# Patient Record
Sex: Male | Born: 1955 | Hispanic: No | Marital: Married | Smoking: Never smoker
Health system: Southern US, Community
[De-identification: ages and names within clinical notes are randomized; demographics above are authoritative.]

---

## 2005-06-22 ENCOUNTER — Ambulatory Visit: Payer: Self-pay | Admitting: Internal Medicine

## 2005-08-14 ENCOUNTER — Ambulatory Visit: Payer: Self-pay | Admitting: Internal Medicine

## 2005-10-02 ENCOUNTER — Ambulatory Visit: Payer: Self-pay | Admitting: Internal Medicine

## 2005-12-12 ENCOUNTER — Encounter: Admission: RE | Admit: 2005-12-12 | Discharge: 2006-02-07 | Payer: Self-pay | Admitting: Internal Medicine

## 2006-04-12 ENCOUNTER — Ambulatory Visit: Payer: Self-pay | Admitting: Internal Medicine

## 2006-08-08 ENCOUNTER — Encounter: Admission: RE | Admit: 2006-08-08 | Discharge: 2006-09-16 | Payer: Self-pay | Admitting: Orthopaedic Surgery

## 2008-05-12 ENCOUNTER — Encounter: Admission: RE | Admit: 2008-05-12 | Discharge: 2008-06-16 | Payer: Self-pay | Admitting: Orthopaedic Surgery

## 2009-02-17 ENCOUNTER — Encounter: Admission: RE | Admit: 2009-02-17 | Discharge: 2009-05-18 | Payer: Self-pay | Admitting: Orthopaedic Surgery

## 2011-06-13 ENCOUNTER — Telehealth: Payer: Self-pay | Admitting: Internal Medicine

## 2011-06-13 NOTE — Telephone Encounter (Signed)
error 

## 2014-08-24 ENCOUNTER — Emergency Department (HOSPITAL_COMMUNITY): Payer: BLUE CROSS/BLUE SHIELD

## 2014-08-24 ENCOUNTER — Emergency Department (HOSPITAL_COMMUNITY)
Admission: EM | Admit: 2014-08-24 | Discharge: 2014-08-24 | Disposition: A | Discharge status: Home / Self Care | Payer: BLUE CROSS/BLUE SHIELD | Attending: Emergency Medicine | Admitting: Emergency Medicine

## 2014-08-24 ENCOUNTER — Encounter (HOSPITAL_COMMUNITY): Payer: Self-pay

## 2014-08-24 DIAGNOSIS — Z8619 Personal history of other infectious and parasitic diseases: Secondary | ICD-10-CM | POA: Diagnosis not present

## 2014-08-24 DIAGNOSIS — R079 Chest pain, unspecified: Secondary | ICD-10-CM | POA: Diagnosis present

## 2014-08-24 DIAGNOSIS — Z79899 Other long term (current) drug therapy: Secondary | ICD-10-CM | POA: Diagnosis not present

## 2014-08-24 LAB — BASIC METABOLIC PANEL
Anion gap: 9 (ref 5–15)
BUN: 16 mg/dL (ref 6–23)
CO2: 25 mmol/L (ref 19–32)
Calcium: 9 mg/dL (ref 8.4–10.5)
Chloride: 103 mmol/L (ref 96–112)
Creatinine, Ser: 0.94 mg/dL (ref 0.50–1.35)
GFR calc Af Amer: >90 mL/min (ref >90)
Glucose, Bld: 105 mg/dL — ABNORMAL HIGH (ref 70–99)
Potassium: 4 mmol/L (ref 3.5–5.1)
Sodium: 137 mmol/L (ref 135–145)

## 2014-08-24 LAB — CBC
HCT: 43 % (ref 39.0–52.0)
Hemoglobin: 14.8 g/dL (ref 13.0–17.0)
MCH: 30.9 pg (ref 26.0–34.0)
MCHC: 34.4 g/dL (ref 30.0–36.0)
MCV: 89.8 fL (ref 78.0–100.0)
PLATELETS: 186 10*3/uL (ref 150–400)
RBC: 4.79 MIL/uL (ref 4.22–5.81)
RDW: 12.6 % (ref 11.5–15.5)
WBC: 6.2 10*3/uL (ref 4.0–10.5)

## 2014-08-24 LAB — I-STAT TROPONIN, ED
Troponin i, poc: 0 ng/mL (ref 0.00–0.08)
Troponin i, poc: 0 ng/mL (ref 0.00–0.08)

## 2014-08-24 LAB — BRAIN NATRIURETIC PEPTIDE: B NATRIURETIC PEPTIDE 5: 9.8 pg/mL (ref 0.0–100.0)

## 2014-08-24 MED ORDER — ASPIRIN 81 MG PO CHEW: 324.0000 mg | CHEWABLE_TABLET | Freq: Every day | ORAL | Status: AC

## 2014-08-24 MED ORDER — OMEPRAZOLE 20 MG PO CPDR: 20.0000 mg | DELAYED_RELEASE_CAPSULE | Freq: Every day | ORAL | Status: AC

## 2014-08-24 NOTE — Discharge Instructions (Signed)

## 2014-08-24 NOTE — ED Notes (Signed)
Pt c/o intermittent L side ribcage tightness radiating into L arm x 1 week.  Pain score 1/10.  Pt reports arm pain feels like tingling.  Pt was started on a shingles medication x 1 week ago and sts "I've been under a lot of stress."

## 2014-08-24 NOTE — ED Provider Notes (Signed)
CSN: [id number]     Arrival date & time 08/24/14  1315 History   First MD Initiated Contact with Patient 08/24/14 1458     Chief Complaint  Patient presents with  . Rib cage pain     Patient is a 59 y.o. male presenting with chest pain. The history is provided by the patient.  Chest Pain Pain location:  L chest Pain quality: sharp and tightness   Pain radiates to:  L arm (arm starts to feel numb and tingling) Pain severity:  Severe Duration:  1 week Timing:  Intermittent (each episode lasting a few hours, occuring at least daily) Chronicity:  New Context comment:  He is taking shingles medications but the symptoms are located on the right side Exacerbated by: doest seem to come on with exertion, he has noticed that when he is drinking coffee it does seem to bring it on, he did take coffee today. Associated symptoms: no fever, no nausea, no PND, no shortness of breath and no weakness   Associated symptoms comment:  He felt lightheaded  Risk factors: no coronary artery disease, no high cholesterol, no hypertension, no prior DVT/PE and no smoking     History reviewed. No pertinent past medical history. History reviewed. No pertinent past surgical history. History reviewed. No pertinent family history. History  Substance Use Topics  . Smoking status: Never Smoker   . Smokeless tobacco: Not on file  . Alcohol Use: Yes    Review of Systems  Constitutional: Negative for fever.  Respiratory: Negative for shortness of breath.   Cardiovascular: Positive for chest pain. Negative for PND.  Gastrointestinal: Negative for nausea.  Neurological: Negative for weakness.      Allergies  Other  Home Medications   Prior to Admission medications   Medication Sig Start Date End Date Taking? Authorizing Provider  Cholecalciferol (VITAMIN D-3 PO) Take 1 tablet by mouth daily.   Yes Historical Provider, MD  FIBER PO Take 1 tablet by mouth daily.   Yes Historical Provider, MD   glucosamine-chondroitin 500-400 MG tablet Take 1 tablet by mouth daily.   Yes Historical Provider, MD  Omega-3 Fatty Acids (OMEGA 3 PO) Take 1 capsule by mouth daily.   Yes Historical Provider, MD  valACYclovir (VALTREX) 1000 MG tablet Take 1 tablet by mouth 3 (three) times daily. 10 days picked up 08/11/14 08/11/14  Yes Historical Provider, MD   BP 159/83 mmHg  Pulse 66  Temp(Src) 98.1 F (36.7 C) (Oral)  Resp 14  SpO2 100% Physical Exam  Constitutional: He appears well-developed and well-nourished. No distress.  HENT:  Head: Normocephalic and atraumatic.  Right Ear: External ear normal.  Left Ear: External ear normal.  Eyes: Conjunctivae are normal. Right eye exhibits no discharge. Left eye exhibits no discharge. No scleral icterus.  Neck: Neck supple. No tracheal deviation present.  Cardiovascular: Normal rate, regular rhythm and intact distal pulses.   Pulmonary/Chest: Effort normal and breath sounds normal. No stridor. No respiratory distress. He has no wheezes. He has no rales.  Abdominal: Soft. Bowel sounds are normal. He exhibits no distension. There is no tenderness. There is no rebound and no guarding.  Musculoskeletal: He exhibits no edema or tenderness.  Neurological: He is alert. He has normal strength. No cranial nerve deficit (no facial droop, extraocular movements intact, no slurred speech) or sensory deficit. He exhibits normal muscle tone. He displays no seizure activity. Coordination normal.  Skin: Skin is warm and dry. No rash noted.  Psychiatric: He has  a normal mood and affect.  Nursing note and vitals reviewed.   ED Course  Procedures (including critical care time) Labs Review Labs Reviewed  BASIC METABOLIC PANEL - Abnormal; Notable for the following:    Glucose, Bld 105 (*)    All other components within normal limits  CBC  BRAIN NATRIURETIC PEPTIDE  I-STAT TROPOININ, ED  I-STAT TROPOININ, ED    Imaging Review No results found.   EKG  Interpretation   Date/Time:  Tuesday August 24 2014 13:20:34 EST Ventricular Rate:  72 PR Interval:  157 QRS Duration: 116 QT Interval:  410 QTC Calculation: 449 R Axis:   39 Text Interpretation:  Sinus rhythm Nonspecific intraventricular conduction  delay No old tracing to compare Confirmed by Tzippy Testerman  MD-J, Kayelynn Abdou ([id number]) on  08/24/2014 3:00:19 PM      MDM   Final diagnoses:  Chest pain, unspecified chest pain type    Pt's symptoms are moderately suspicious.  He does not have symptoms with exertion.  Low risk for heart disease, heart score of 1.  Reasonable for outpatient follow up.  He has noticed some symptoms while drinking coffee so I will have him start an antacid.   Linwood DibblesJon Jenia Klepper, MD 08/24/14 [phone]

## 2014-08-24 NOTE — Progress Notes (Signed)
  CARE MANAGEMENT ED NOTE 08/24/2014  Patient:  Frederick Watson,Frederick Watson   Account Number:  [id number]  Date Initiated:  08/24/2014  Documentation initiated by:  Edd ArbourGIBBS,Quantez Schnyder  Subjective/Objective Assessment:   59 yr old bcbs pt c/o intermittent L side ribcage tightness radiating into L arm x 1 week.  Pain score 1/10.  Pt reports arm pain feels like tingling.  Pt was started on a shingles medication x 1 week ago and sts "I've been under a lot of     Subjective/Objective Assessment Detail:   stress."          No pcp as confirmed by pt     Action/Plan:   see notes below   Action/Plan Detail:   Anticipated DC Date:  08/24/2014     Status Recommendation to Physician:   Result of Recommendation:    Other ED Services  Consult Working Plan    DC Planning Services  Other  Outpatient Services - Pt will follow up    Choice offered to / List presented to:            Status of service:  Completed, signed off  ED Comments:   ED Comments Detail:  [facility] ED CM spoke with pt on how to obtain an in network pcp with insurance coverage via the customer service number or web site Cm reviewed ED level of care for crisis/emergent services and community pcp level of care to manage continuous or chronic medical concerns.  The pt voiced understanding CM encouraged pt and discussed pt's responsibility to verify with pt's insurance carrier that any recommended medical provider offered by any emergency room or a hospital provider is within the carrier's network. The pt voiced understanding

## 2014-09-14 ENCOUNTER — Encounter: Payer: BLUE CROSS/BLUE SHIELD | Admitting: Cardiovascular Disease

## 2014-10-17 NOTE — Progress Notes (Signed)
erron

## 2014-10-18 ENCOUNTER — Encounter: Payer: BLUE CROSS/BLUE SHIELD | Admitting: Cardiovascular Disease

## 2014-11-03 ENCOUNTER — Encounter: Payer: Self-pay | Admitting: Cardiovascular Disease

## 2015-11-28 IMAGING — CR DG CHEST 2V
2 series · 2 of 2 positions shown · non-contrast
Comparison: None.

CLINICAL DATA: Left-sided chest pain

EXAM:
CHEST  2 VIEW

[w chest pa]
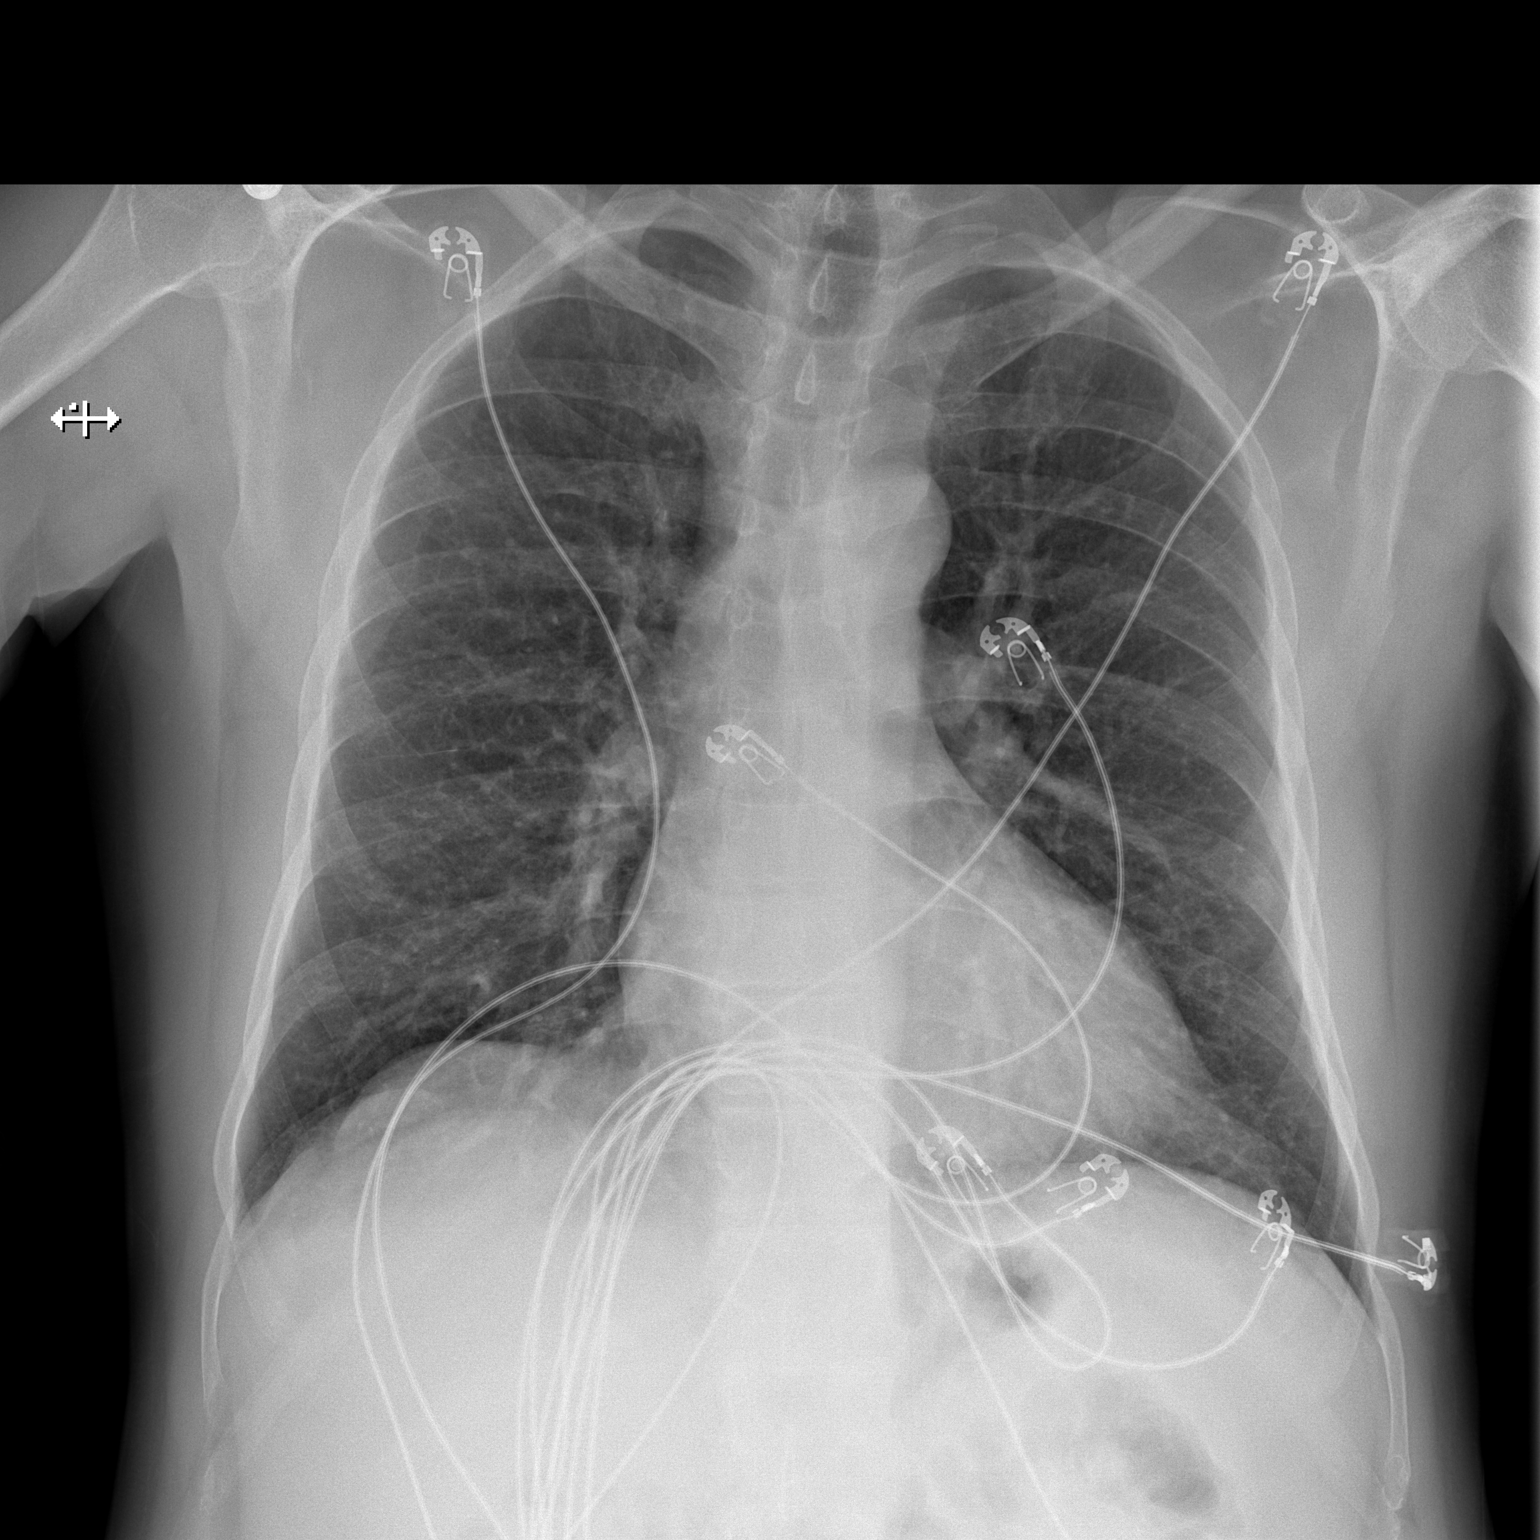

[w chest lat]
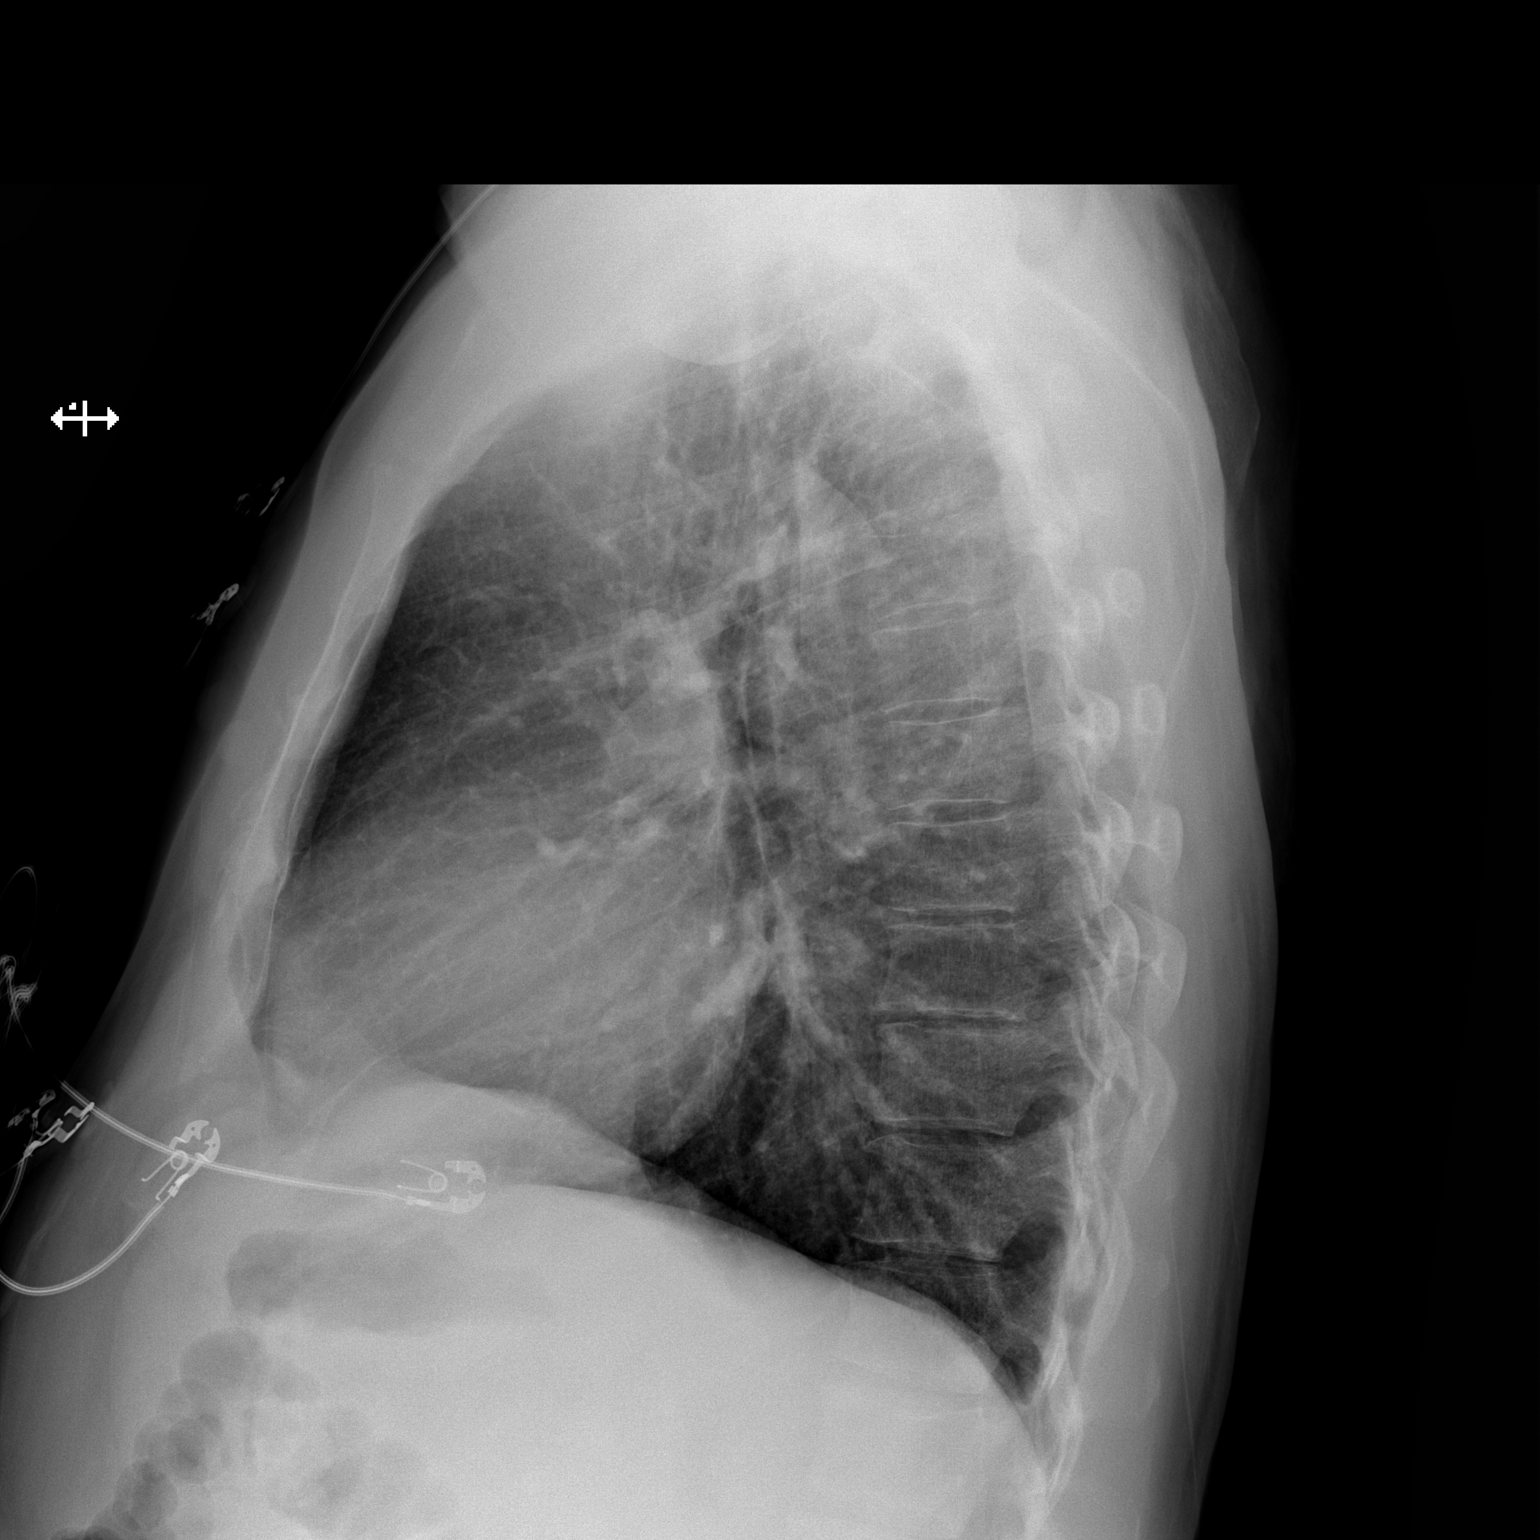

[2 of 2 positions shown; findings below may reference images not displayed]

FINDINGS: Lungs are clear. Heart is upper normal in size with pulmonary
vascularity. No adenopathy. No pneumothorax. No bone lesions.
IMPRESSION: No edema or consolidation.
